# Patient Record
Sex: Male | Born: 1995 | Race: Black or African American | Hispanic: No | Marital: Single | Smoking: Current some day smoker
Health system: Southern US, Community
[De-identification: ages and names within clinical notes are randomized; demographics above are authoritative.]

---

## 2015-09-25 ENCOUNTER — Encounter (HOSPITAL_COMMUNITY): Payer: Self-pay | Admitting: Emergency Medicine

## 2015-09-25 ENCOUNTER — Ambulatory Visit (HOSPITAL_COMMUNITY)
Admission: EM | Admit: 2015-09-25 | Discharge: 2015-09-25 | Disposition: A | Discharge status: Home / Self Care | Payer: No Typology Code available for payment source | Attending: Family Medicine | Admitting: Family Medicine

## 2015-09-25 ENCOUNTER — Ambulatory Visit (INDEPENDENT_AMBULATORY_CARE_PROVIDER_SITE_OTHER): Payer: No Typology Code available for payment source

## 2015-09-25 DIAGNOSIS — M549 Dorsalgia, unspecified: Secondary | ICD-10-CM

## 2015-09-25 DIAGNOSIS — M542 Cervicalgia: Secondary | ICD-10-CM

## 2015-09-25 MED ORDER — DICLOFENAC POTASSIUM 50 MG PO TABS: 50.0000 mg | ORAL_TABLET | Freq: Three times a day (TID) | ORAL | Status: DC
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
## 2015-09-27 ENCOUNTER — Ambulatory Visit (HOSPITAL_COMMUNITY)
Admission: EM | Admit: 2015-09-27 | Discharge: 2015-09-27 | Disposition: A | Discharge status: Home / Self Care | Payer: No Typology Code available for payment source | Attending: Emergency Medicine | Admitting: Emergency Medicine

## 2015-09-27 ENCOUNTER — Encounter (HOSPITAL_COMMUNITY): Payer: Self-pay | Admitting: Emergency Medicine

## 2015-09-27 DIAGNOSIS — M62830 Muscle spasm of back: Secondary | ICD-10-CM

## 2015-09-27 MED ORDER — MELOXICAM 7.5 MG PO TABS: 7.5000 mg | ORAL_TABLET | Freq: Every day | ORAL | Status: DC

## 2015-09-27 MED ORDER — TRAMADOL HCL 50 MG PO TABS: 50.0000 mg | ORAL_TABLET | Freq: Four times a day (QID) | ORAL | Status: DC | PRN

## 2015-09-27 MED ORDER — CYCLOBENZAPRINE HCL 5 MG PO TABS: 5.0000 mg | ORAL_TABLET | Freq: Three times a day (TID) | ORAL | Status: DC | PRN
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
## 2015-09-27 NOTE — Discharge Instructions (Signed)
You have spasm in the muscles of your back. Stop the diclofenac as this is upsetting her stomach. Take meloxicam daily as needed for pain. Use the Flexeril every 8 hours as needed for pain and spasm. This medicine may make you drowsy. Use the tramadol every 6-8 hours as needed for severe pain. Do not drive while taking this medicine.  Moist heat to the back will also help relieve the spasm. Do gentle stretching. When you pick up your son, kneel down and lift with your legs rather than your back. This may take 2 weeks to get better. Follow-up as needed.

## 2015-10-16 ENCOUNTER — Encounter (HOSPITAL_COMMUNITY): Payer: Self-pay | Admitting: Emergency Medicine

## 2015-10-16 ENCOUNTER — Ambulatory Visit (HOSPITAL_COMMUNITY)
Admission: EM | Admit: 2015-10-16 | Discharge: 2015-10-16 | Disposition: A | Discharge status: Home / Self Care | Payer: No Typology Code available for payment source | Attending: Emergency Medicine | Admitting: Emergency Medicine

## 2015-10-16 DIAGNOSIS — M62838 Other muscle spasm: Secondary | ICD-10-CM

## 2015-10-16 DIAGNOSIS — M6249 Contracture of muscle, multiple sites: Secondary | ICD-10-CM

## 2015-10-16 MED ORDER — MELOXICAM 7.5 MG PO TABS: 7.5000 mg | ORAL_TABLET | Freq: Every day | ORAL | Status: DC

## 2015-10-16 MED ORDER — CYCLOBENZAPRINE HCL 5 MG PO TABS: 5.0000 mg | ORAL_TABLET | Freq: Three times a day (TID) | ORAL | Status: DC | PRN
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
## 2016-10-27 ENCOUNTER — Ambulatory Visit (HOSPITAL_COMMUNITY)
Admission: EM | Admit: 2016-10-27 | Discharge: 2016-10-27 | Disposition: A | Discharge status: Home / Self Care | Payer: BLUE CROSS/BLUE SHIELD | Attending: Family Medicine | Admitting: Family Medicine

## 2016-10-27 ENCOUNTER — Encounter (HOSPITAL_COMMUNITY): Payer: Self-pay | Admitting: Emergency Medicine

## 2016-10-27 ENCOUNTER — Ambulatory Visit (INDEPENDENT_AMBULATORY_CARE_PROVIDER_SITE_OTHER): Payer: BLUE CROSS/BLUE SHIELD

## 2016-10-27 DIAGNOSIS — S62632A Displaced fracture of distal phalanx of right middle finger, initial encounter for closed fracture: Secondary | ICD-10-CM

## 2016-10-27 DIAGNOSIS — S62630A Displaced fracture of distal phalanx of right index finger, initial encounter for closed fracture: Secondary | ICD-10-CM

## 2016-10-27 MED ORDER — ACETAMINOPHEN 325 MG PO TABS
975.0000 mg | ORAL_TABLET | Freq: Once | ORAL | Status: AC
  Administered 2016-10-27: 975 mg via ORAL

## 2016-10-27 MED ORDER — IBUPROFEN 800 MG PO TABS
800.0000 mg | ORAL_TABLET | Freq: Once | ORAL | Status: AC
  Administered 2016-10-27: 800 mg via ORAL

## 2016-10-27 MED ORDER — HYDROCODONE-ACETAMINOPHEN 5-325 MG PO TABS: 1.0000 | ORAL_TABLET | Freq: Four times a day (QID) | ORAL | 0 refills | Status: DC | PRN

## 2016-10-27 MED ORDER — ACETAMINOPHEN 325 MG PO TABS
ORAL_TABLET | ORAL | Status: AC
  Filled 2016-10-27: qty 3

## 2016-10-27 MED ORDER — IBUPROFEN 800 MG PO TABS
ORAL_TABLET | ORAL | Status: AC
  Filled 2016-10-27: qty 1
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
## 2016-10-30 ENCOUNTER — Ambulatory Visit (HOSPITAL_COMMUNITY)
Admission: EM | Admit: 2016-10-30 | Discharge: 2016-10-30 | Disposition: A | Discharge status: Home / Self Care | Payer: BLUE CROSS/BLUE SHIELD | Attending: Internal Medicine | Admitting: Internal Medicine

## 2016-10-30 ENCOUNTER — Encounter (HOSPITAL_COMMUNITY): Payer: Self-pay | Admitting: Emergency Medicine

## 2016-10-30 DIAGNOSIS — S62632D Displaced fracture of distal phalanx of right middle finger, subsequent encounter for fracture with routine healing: Secondary | ICD-10-CM

## 2016-10-30 MED ORDER — HYDROCODONE-ACETAMINOPHEN 5-325 MG PO TABS: 1.0000 | ORAL_TABLET | Freq: Four times a day (QID) | ORAL | 0 refills | Status: DC | PRN

## 2016-10-30 NOTE — ED Provider Notes (Signed)
CSN: [id number]     Arrival date & time 10/30/16  1038 History   None    Chief Complaint  Patient presents with  . Follow-up   (Consider location/radiation/quality/duration/timing/severity/associated sxs/prior Treatment) Patient c/o right hand fracture and is awaiting an appointment with hand surgery and is out of pain meds.   The history is provided by the patient.  Hand Pain  This is a new problem. The problem occurs constantly. The problem has not changed since onset.Nothing aggravates the symptoms. Nothing relieves the symptoms. He has tried nothing for the symptoms.    History reviewed. No pertinent past medical history. History reviewed. No pertinent surgical history. History reviewed. No pertinent family history. Social History  Substance Use Topics  . Smoking status: Current Some Day Smoker  . Smokeless tobacco: Never Used  . Alcohol use No    Review of Systems  Constitutional: Negative.   HENT: Negative.   Eyes: Negative.   Respiratory: Negative.   Cardiovascular: Negative.   Gastrointestinal: Negative.   Endocrine: Negative.   Genitourinary: Negative.   Musculoskeletal: Positive for arthralgias.  Allergic/Immunologic: Negative.   Neurological: Negative.   Hematological: Negative.   Psychiatric/Behavioral: Negative.     Allergies  Patient has no known allergies.  Home Medications   Prior to Admission medications   Medication Sig Start Date End Date Taking? Authorizing Provider  HYDROcodone-acetaminophen (NORCO/VICODIN) 5-325 MG tablet Take 1 tablet by mouth every 6 (six) hours as needed. 10/30/16   Deatra Canterxford, William J, FNP   Meds Ordered and Administered this Visit  Medications - No data to display  BP (!) 128/93 (BP Location: Left Arm)   Pulse 68   Temp 98.8 F (37.1 C) (Oral)   Resp 20   SpO2 100%  No data found.   Physical Exam  Constitutional: He is oriented to person, place, and time. He appears well-developed and well-nourished.  HENT:   Head: Normocephalic and atraumatic.  Eyes: Conjunctivae and EOM are normal. Pupils are equal, round, and reactive to light.  Neck: Normal range of motion. Neck supple.  Cardiovascular: Normal rate, regular rhythm and normal heart sounds.   Pulmonary/Chest: Effort normal and breath sounds normal.  Neurological: He is alert and oriented to person, place, and time.  Nursing note and vitals reviewed.   Urgent Care Course     Procedures (including critical care time)  Labs Review Labs Reviewed - No data to display  Imaging Review No results found.   Visual Acuity Review  Right Eye Distance:   Left Eye Distance:   Bilateral Distance:    Right Eye Near:   Left Eye Near:    Bilateral Near:         MDM   1. Displaced fracture of distal phalanx of right middle finger, subsequent encounter for fracture with routine healing     Hydrocodone 5/325 one po q 6 hours prn #10  Follow up with Dr. Glenna FellowsGramig [facility].  His office will call you with an appointment.       Deatra CanterOxford, William J, FNP 10/30/16 1236

## 2016-10-30 NOTE — Discharge Instructions (Signed)
Dr. Carlos LeveringGramig's office will call you today or Monday for an appointment ASAP.

## 2016-10-30 NOTE — ED Triage Notes (Signed)
Pt is here for a f/u from 6/28... Seen here for hand inj/fx  Also sts he ran out of pain meds.   Voices no new concerns... A&O x4... NAD... Ambulatory

## 2018-12-29 ENCOUNTER — Encounter (HOSPITAL_COMMUNITY): Payer: Self-pay | Admitting: Emergency Medicine

## 2018-12-29 ENCOUNTER — Ambulatory Visit (INDEPENDENT_AMBULATORY_CARE_PROVIDER_SITE_OTHER): Payer: BC Managed Care – PPO

## 2018-12-29 ENCOUNTER — Other Ambulatory Visit: Payer: Self-pay

## 2018-12-29 ENCOUNTER — Ambulatory Visit (HOSPITAL_COMMUNITY)
Admission: EM | Admit: 2018-12-29 | Discharge: 2018-12-29 | Disposition: A | Discharge status: Home / Self Care | Payer: BC Managed Care – PPO | Attending: Family Medicine | Admitting: Family Medicine

## 2018-12-29 DIAGNOSIS — L089 Local infection of the skin and subcutaneous tissue, unspecified: Secondary | ICD-10-CM

## 2018-12-29 DIAGNOSIS — T148XXA Other injury of unspecified body region, initial encounter: Secondary | ICD-10-CM

## 2018-12-29 DIAGNOSIS — S81812D Laceration without foreign body, left lower leg, subsequent encounter: Secondary | ICD-10-CM | POA: Diagnosis not present

## 2018-12-29 DIAGNOSIS — F17200 Nicotine dependence, unspecified, uncomplicated: Secondary | ICD-10-CM | POA: Insufficient documentation

## 2018-12-29 MED ORDER — SULFAMETHOXAZOLE-TRIMETHOPRIM 800-160 MG PO TABS: 1.0000 | ORAL_TABLET | Freq: Two times a day (BID) | ORAL | 0 refills | Status: AC

## 2018-12-29 MED ORDER — TRAMADOL HCL 50 MG PO TABS: 50.0000 mg | ORAL_TABLET | Freq: Four times a day (QID) | ORAL | 0 refills | Status: DC | PRN

## 2018-12-29 MED ORDER — IBUPROFEN 800 MG PO TABS: 800.0000 mg | ORAL_TABLET | Freq: Three times a day (TID) | ORAL | 0 refills | Status: DC

## 2018-12-29 NOTE — Discharge Instructions (Addendum)
Take the antibiotic 2 times a day.  This is for the wound infection in your leg. Wash your leg once a day with soap and water and apply a bandage.  You may use a small amount of antibiotic ointment like bacitracin Take ibuprofen 3 times a day with food.  This will help with moderate pain. If your pain is severe you may take tramadol.  Take this with food. Use crutches for walking.  Stay off your leg as much as you can.  Elevate your leg to reduce the swelling Return here in 2 days for a wound check.  Stitches will not come out until 10 days.  You are welcome to call for an appointment or check online
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
## 2019-01-01 LAB — AEROBIC CULTURE? (SUPERFICIAL SPECIMEN)

## 2019-01-01 LAB — AEROBIC CULTURE W GRAM STAIN (SUPERFICIAL SPECIMEN)

## 2019-01-02 ENCOUNTER — Telehealth (HOSPITAL_COMMUNITY): Payer: Self-pay | Admitting: Emergency Medicine

## 2019-01-02 NOTE — Telephone Encounter (Signed)
Treated with bactrim, contacted pt to see how he was feeling, pt states he is feeling better.

## 2019-07-16 ENCOUNTER — Ambulatory Visit (INDEPENDENT_AMBULATORY_CARE_PROVIDER_SITE_OTHER): Payer: BC Managed Care – PPO

## 2019-07-16 ENCOUNTER — Ambulatory Visit (HOSPITAL_COMMUNITY)
Admission: EM | Admit: 2019-07-16 | Discharge: 2019-07-16 | Disposition: A | Discharge status: Home / Self Care | Payer: BC Managed Care – PPO | Attending: Family Medicine | Admitting: Family Medicine

## 2019-07-16 ENCOUNTER — Other Ambulatory Visit: Payer: Self-pay

## 2019-07-16 ENCOUNTER — Ambulatory Visit (HOSPITAL_COMMUNITY): Payer: BC Managed Care – PPO

## 2019-07-16 ENCOUNTER — Encounter (HOSPITAL_COMMUNITY): Payer: Self-pay

## 2019-07-16 DIAGNOSIS — S60519A Abrasion of unspecified hand, initial encounter: Secondary | ICD-10-CM | POA: Diagnosis not present

## 2019-07-16 DIAGNOSIS — M79641 Pain in right hand: Secondary | ICD-10-CM

## 2019-07-16 DIAGNOSIS — S60221A Contusion of right hand, initial encounter: Secondary | ICD-10-CM | POA: Diagnosis not present

## 2019-07-16 DIAGNOSIS — M79642 Pain in left hand: Secondary | ICD-10-CM

## 2019-07-16 MED ORDER — BACITRACIN ZINC 500 UNIT/GM EX OINT
TOPICAL_OINTMENT | CUTANEOUS | Status: AC
  Filled 2019-07-16: qty 28.35

## 2019-07-16 MED ORDER — MUPIROCIN 2 % EX OINT: 1.0000 "application " | TOPICAL_OINTMENT | Freq: Three times a day (TID) | CUTANEOUS | 1 refills | Status: AC

## 2019-07-16 MED ORDER — TRAMADOL HCL 50 MG PO TABS: 50.0000 mg | ORAL_TABLET | Freq: Four times a day (QID) | ORAL | 0 refills | Status: AC | PRN

## 2019-07-16 NOTE — ED Triage Notes (Signed)
Pt is here with hand injurys to both hands, states he fell & caught his self on his hands yesterday morning. States he thinks his thumbs are messed up, they hurt the worst.
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
## 2019-07-16 NOTE — Discharge Instructions (Addendum)
Wash hands at least twice a day and then put Mupiricin dressing with Telfa, gauze and coban.  Recheck the hand on Wednesday next.

## 2019-07-19 ENCOUNTER — Other Ambulatory Visit: Payer: Self-pay

## 2019-07-19 ENCOUNTER — Encounter (HOSPITAL_COMMUNITY): Payer: Self-pay | Admitting: Emergency Medicine

## 2019-07-19 ENCOUNTER — Ambulatory Visit (HOSPITAL_COMMUNITY)
Admission: EM | Admit: 2019-07-19 | Discharge: 2019-07-19 | Disposition: A | Discharge status: Home / Self Care | Payer: BC Managed Care – PPO | Attending: Urgent Care | Admitting: Urgent Care

## 2019-07-19 DIAGNOSIS — S60511D Abrasion of right hand, subsequent encounter: Secondary | ICD-10-CM

## 2019-07-19 DIAGNOSIS — S60419D Abrasion of unspecified finger, subsequent encounter: Secondary | ICD-10-CM

## 2019-07-19 DIAGNOSIS — S60221D Contusion of right hand, subsequent encounter: Secondary | ICD-10-CM

## 2019-07-19 DIAGNOSIS — M79641 Pain in right hand: Secondary | ICD-10-CM

## 2019-07-19 MED ORDER — NAPROXEN 500 MG PO TABS: 500.0000 mg | ORAL_TABLET | Freq: Two times a day (BID) | ORAL | 0 refills | Status: AC

## 2019-07-19 NOTE — ED Provider Notes (Signed)
[facility]   MRN: [id number] DOB: 16-Oct-1995  Subjective:   Vincent Ball is a 24 y.o. male presenting for recheck on his bilateral hand wounds.  Patient is requesting a note for work as he still had to miss this weeks work days.  He still has persistent right hand pain and some swelling.  Has only been using tramadol.  He is nursing his wounds as instructed.  Denies fever, redness, drainage of pus or bleeding.  No current facility-administered medications for this encounter.  Current Outpatient Medications:  .  mupirocin ointment (BACTROBAN) 2 %, Apply 1 application topically 3 (three) times daily., Disp: 30 g, Rfl: 1 .  traMADol (ULTRAM) 50 MG tablet, Take 1-2 tablets (50-100 mg total) by mouth every 6 (six) hours as needed., Disp: 20 tablet, Rfl: 0 .  ibuprofen (ADVIL) 800 MG tablet, Take 1 tablet (800 mg total) by mouth 3 (three) times daily., Disp: 21 tablet, Rfl: 0   No Known Allergies  History reviewed. No pertinent past medical history.   History reviewed. No pertinent surgical history.  Family History  Problem Relation Age of Onset  . Healthy Mother   . Healthy Father     Social History   Tobacco Use  . Smoking status: Current Some Day Smoker  . Smokeless tobacco: Never Used  Substance Use Topics  . Alcohol use: No  . Drug use: No    ROS   Objective:   Vitals: BP (!) 169/95   Pulse 86   Temp 98.6 F (37 C) (Oral)   Resp 16   SpO2 97%   Physical Exam Constitutional:      General: He is not in acute distress.    Appearance: Normal appearance. He is well-developed and normal weight. He is not ill-appearing, toxic-appearing or diaphoretic.  HENT:     Head: Normocephalic and atraumatic.     Right Ear: External ear normal.     Left Ear: External ear normal.     Nose: Nose normal.     Mouth/Throat:     Pharynx: Oropharynx is clear.  Eyes:     General: No scleral icterus.       Right eye: No discharge.        Left eye: No discharge.   Extraocular Movements: Extraocular movements intact.     Pupils: Pupils are equal, round, and reactive to light.  Cardiovascular:     Rate and Rhythm: Normal rate.  Pulmonary:     Effort: Pulmonary effort is normal.  Musculoskeletal:     Right hand: Tenderness (over thenar eminence of right thumb) present. No swelling, deformity, lacerations or bony tenderness. Normal range of motion. Normal strength. Normal sensation. Normal capillary refill.     Left hand: No swelling, deformity, lacerations, tenderness or bony tenderness. Normal range of motion. Normal strength. Normal sensation. Normal capillary refill.       Arms:     Cervical back: Normal range of motion.  Neurological:     Mental Status: He is alert and oriented to person, place, and time.  Psychiatric:        Mood and Affect: Mood normal.        Behavior: Behavior normal.        Thought Content: Thought content normal.        Judgment: Judgment normal.      Assessment and Plan :   1. Contusion of right hand, subsequent encounter   2. Right hand pain   3. Abrasion of  right hand, subsequent encounter   4. Abrasion of finger of left hand, subsequent encounter     Resolving abrasions, wound care reviewed.  Start naproxen for pain and inflammation.  Use tramadol as needed for severe pain. Counseled patient on potential for adverse effects with medications prescribed/recommended today, ER and return-to-clinic precautions discussed, patient verbalized understanding.    Jaynee Eagles, [place] 07/19/19 [phone]

## 2019-07-19 NOTE — Discharge Instructions (Signed)
Please change your dressing 2-3 times daily. Do not apply any ointments or creams. Each time you change your dressing, make sure you clean gently around the perimeter of the wound with gentle soap and warm water. Pat your wound dry and let it air out if possible for 1-2 hours before reapplying another dressing.  °
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
## 2021-06-13 ENCOUNTER — Emergency Department (HOSPITAL_COMMUNITY): Payer: BC Managed Care – PPO

## 2021-06-13 ENCOUNTER — Other Ambulatory Visit: Payer: Self-pay

## 2021-06-13 ENCOUNTER — Encounter (HOSPITAL_COMMUNITY): Payer: Self-pay

## 2021-06-13 ENCOUNTER — Emergency Department (HOSPITAL_COMMUNITY)
Admission: EM | Admit: 2021-06-13 | Discharge: 2021-06-13 | Disposition: A | Discharge status: Home / Self Care | Payer: BC Managed Care – PPO | Attending: Emergency Medicine | Admitting: Emergency Medicine

## 2021-06-13 ENCOUNTER — Ambulatory Visit (HOSPITAL_COMMUNITY)
Admission: EM | Admit: 2021-06-13 | Discharge: 2021-06-13 | Disposition: A | Discharge status: Other Acute Inpatient Hospital | Payer: Self-pay | Attending: Internal Medicine | Admitting: Internal Medicine

## 2021-06-13 DIAGNOSIS — N50811 Right testicular pain: Secondary | ICD-10-CM

## 2021-06-13 DIAGNOSIS — N451 Epididymitis: Secondary | ICD-10-CM | POA: Insufficient documentation

## 2021-06-13 DIAGNOSIS — Z20822 Contact with and (suspected) exposure to covid-19: Secondary | ICD-10-CM | POA: Insufficient documentation

## 2021-06-13 LAB — COMPREHENSIVE METABOLIC PANEL
ALT: 22 U/L (ref 0–44)
AST: 26 U/L (ref 15–41)
Albumin: 4.3 g/dL (ref 3.5–5.0)
Alkaline Phosphatase: 50 U/L (ref 38–126)
Anion gap: 9 (ref 5–15)
BUN: 9 mg/dL (ref 6–20)
CO2: 26 mmol/L (ref 22–32)
Calcium: 9.2 mg/dL (ref 8.9–10.3)
Chloride: 103 mmol/L (ref 98–111)
Creatinine, Ser: 1.14 mg/dL (ref 0.61–1.24)
GFR, Estimated: >60 mL/min (ref >60)
Glucose, Bld: 101 mg/dL — ABNORMAL HIGH (ref 70–99)
Potassium: 3.5 mmol/L (ref 3.5–5.1)
Sodium: 138 mmol/L (ref 135–145)
Total Bilirubin: 0.7 mg/dL (ref 0.3–1.2)
Total Protein: 7.1 g/dL (ref 6.5–8.1)

## 2021-06-13 LAB — RESP PANEL BY RT-PCR (FLU A&B, COVID) ARPGX2
Influenza A by PCR: NEGATIVE
Influenza B by PCR: NEGATIVE
SARS Coronavirus 2 by RT PCR: NEGATIVE

## 2021-06-13 LAB — URINALYSIS, ROUTINE W REFLEX MICROSCOPIC
Bilirubin Urine: NEGATIVE
Glucose, UA: NEGATIVE mg/dL
Hgb urine dipstick: NEGATIVE
Ketones, ur: NEGATIVE mg/dL
Leukocytes,Ua: NEGATIVE
Nitrite: NEGATIVE
Protein, ur: NEGATIVE mg/dL
Specific Gravity, Urine: 1.015 (ref 1.005–1.030)
pH: 6 (ref 5.0–8.0)

## 2021-06-13 LAB — CBC WITH DIFFERENTIAL/PLATELET
Abs Immature Granulocytes: 0.04 10*3/uL (ref 0.00–0.07)
Basophils Absolute: 0 10*3/uL (ref 0.0–0.1)
Basophils Relative: 0 %
Eosinophils Absolute: 0 10*3/uL (ref 0.0–0.5)
Eosinophils Relative: 0 %
HCT: 36.8 % — ABNORMAL LOW (ref 39.0–52.0)
Hemoglobin: 11.7 g/dL — ABNORMAL LOW (ref 13.0–17.0)
Immature Granulocytes: 0 %
Lymphocytes Relative: 5 %
Lymphs Abs: 0.6 10*3/uL — ABNORMAL LOW (ref 0.7–4.0)
MCH: 28.4 pg (ref 26.0–34.0)
MCHC: 31.8 g/dL (ref 30.0–36.0)
MCV: 89.3 fL (ref 80.0–100.0)
Monocytes Absolute: 0.4 10*3/uL (ref 0.1–1.0)
Monocytes Relative: 3 %
Neutro Abs: 12.4 10*3/uL — ABNORMAL HIGH (ref 1.7–7.7)
Neutrophils Relative %: 92 %
Platelets: 153 10*3/uL (ref 150–400)
RBC: 4.12 MIL/uL — ABNORMAL LOW (ref 4.22–5.81)
RDW: 11.5 % (ref 11.5–15.5)
WBC: 13.5 10*3/uL — ABNORMAL HIGH (ref 4.0–10.5)
nRBC: 0 % (ref 0.0–0.2)

## 2021-06-13 LAB — LACTIC ACID, PLASMA: Lactic Acid, Venous: 1 mmol/L (ref 0.5–1.9)

## 2021-06-13 MED ORDER — CEFTRIAXONE SODIUM 500 MG IJ SOLR
500.0000 mg | Freq: Once | INTRAMUSCULAR | Status: AC
  Administered 2021-06-13: 500 mg via INTRAMUSCULAR
  Filled 2021-06-13: qty 500

## 2021-06-13 MED ORDER — CIPROFLOXACIN HCL 500 MG PO TABS: 500.0000 mg | ORAL_TABLET | Freq: Two times a day (BID) | ORAL | 0 refills | Status: AC

## 2021-06-13 MED ORDER — LIDOCAINE HCL (PF) 1 % IJ SOLN
INTRAMUSCULAR | Status: AC
  Filled 2021-06-13: qty 5

## 2021-06-13 MED ORDER — ACETAMINOPHEN 325 MG PO TABS
650.0000 mg | ORAL_TABLET | Freq: Once | ORAL | Status: AC
  Administered 2021-06-13: 650 mg via ORAL

## 2021-06-13 MED ORDER — AZITHROMYCIN 250 MG PO TABS
1000.0000 mg | ORAL_TABLET | Freq: Once | ORAL | Status: AC
  Administered 2021-06-13: 1000 mg via ORAL
  Filled 2021-06-13: qty 4

## 2021-06-13 MED ORDER — ACETAMINOPHEN 325 MG PO TABS
ORAL_TABLET | ORAL | Status: AC
  Filled 2021-06-13: qty 2

## 2021-06-13 NOTE — ED Provider Triage Note (Addendum)
Emergency Medicine Provider Triage Evaluation Note  Vincent Ball , a 26 y.o. male  was evaluated in triage.  Pt complains of right testicle pain and swelling.  Reports similar episode 1 month ago that resolved, thought to be related to overuse, came back today while he was at work.  Also reports urinary frequency, denies urethral discharge.  Does report lesions to his penis. Febrile, given APAP at 5 by UC and sent to ER for eval Review of Systems  Positive: Lesions, testicle pain, swelling, fever Negative: Dysuria, discharge  Physical Exam  There were no vitals taken for this visit. Gen:   Awake, no distress   Resp:  Normal effort  MSK:   Moves extremities without difficulty  Other:  Swelling with tenderness of the right testicle  Medical Decision Making  Medically screening exam initiated at 6:15 PM.  Appropriate orders placed.  Vincent Ball was informed that the remainder of the evaluation will be completed by another provider, this initial triage assessment does not replace that evaluation, and the importance of remaining in the ED until their evaluation is complete.     Tacy Learn, PA-C 06/13/21 1819    Tacy Learn, PA-C 06/13/21 Vincent Ball

## 2021-06-13 NOTE — Discharge Instructions (Signed)
Begin taking Cipro as prescribed.  Take ibuprofen 600 mg every 6 hours as needed for pain.  We will call you if your culture requires further treatment or the need for additional action.  Return to the emergency department if symptoms significantly worsen or change.

## 2021-06-13 NOTE — Discharge Instructions (Addendum)
-  Head straight to the ED for further management. I'm concerned you have a bad infection in the testicle. This will likely require an ultrasound imaging and antibiotics, as well as rapid labwork I can't perform here.

## 2021-06-13 NOTE — ED Provider Notes (Signed)
[facility]    CSN: [id number] Arrival date & time: 06/13/21  1716      History   Chief Complaint Chief Complaint  Patient presents with   Abdominal Pain   Testicle Pain    HPI JESHURUN OAXACA is a 26 y.o. male presenting with R testicle pain x1 month. Medical history noncontributory. States symptoms improved but then got a lot worse few days ago. R testicle is large and swollen. There is now abdominal pain as well. Denies other symptoms like penile discharge. Hasn't attempted interventions at home.  HPI  History reviewed. No pertinent past medical history.  There are no problems to display for this patient.   History reviewed. No pertinent surgical history.     Home Medications    Prior to Admission medications   Medication Sig Start Date End Date Taking? Authorizing Provider  mupirocin ointment (BACTROBAN) 2 % Apply 1 application topically 3 (three) times daily. Patient not taking: Reported on 06/13/2021 07/16/19   Elvina Sidle, MD  naproxen (NAPROSYN) 500 MG tablet Take 1 tablet (500 mg total) by mouth 2 (two) times daily. Patient not taking: Reported on 06/13/2021 07/19/19   Wallis Bamberg, PA-C  traMADol (ULTRAM) 50 MG tablet Take 1-2 tablets (50-100 mg total) by mouth every 6 (six) hours as needed. Patient not taking: Reported on 06/13/2021 07/16/19   Elvina Sidle, MD    Family History Family History  Problem Relation Age of Onset   Healthy Mother    Healthy Father     Social History Social History   Tobacco Use   Smoking status: Some Days   Smokeless tobacco: Never  Substance Use Topics   Alcohol use: Yes   Drug use: Yes    Types: Marijuana     Allergies   Patient has no known allergies.   Review of Systems Review of Systems  Genitourinary:  Positive for testicular pain.  All other systems reviewed and are negative.   Physical Exam Triage Vital Signs ED Triage Vitals  Enc Vitals Group     BP 06/13/21 1746 118/68     Pulse  Rate 06/13/21 1746 (!) 112     Resp 06/13/21 1746 14     Temp 06/13/21 1746 (!) 102.2 F (39 C)     Temp Source 06/13/21 1746 Oral     SpO2 06/13/21 1746 98 %     Weight --      Height --      Head Circumference --      Peak Flow --      Pain Score 06/13/21 1747 5     Pain Loc --      Pain Edu? --      Excl. in GC? --    No data found.  Updated Vital Signs BP 118/68 (BP Location: Left Arm)    Pulse (!) 112    Temp (!) 102.2 F (39 C) (Oral)    Resp 14    SpO2 98%   Visual Acuity Right Eye Distance:   Left Eye Distance:   Bilateral Distance:    Right Eye Near:   Left Eye Near:    Bilateral Near:     Physical Exam Vitals reviewed.  Constitutional:      General: He is not in acute distress.    Appearance: Normal appearance. He is not ill-appearing.  HENT:     Head: Normocephalic and atraumatic.  Cardiovascular:     Rate and Rhythm: Tachycardia present.  Pulmonary:  Effort: Pulmonary effort is normal.  Genitourinary:    Comments: declined Neurological:     General: No focal deficit present.     Mental Status: He is alert and oriented to person, place, and time.  Psychiatric:        Mood and Affect: Mood normal.        Behavior: Behavior normal.        Thought Content: Thought content normal.        Judgment: Judgment normal.     UC Treatments / Results  Labs (all labs ordered are listed, but only abnormal results are displayed) Labs Reviewed - No data to display  EKG   Radiology No results found.  Procedures Procedures (including critical care time)  Medications Ordered in UC Medications  acetaminophen (TYLENOL) tablet 650 mg (650 mg Oral Given 06/13/21 1752)    Initial Impression / Assessment and Plan / UC Course  I have reviewed the triage vital signs and the nursing notes.  Pertinent labs & imaging results that were available during my care of the patient were reviewed by me and considered in my medical decision making (see chart for  details).     This patient is a very pleasant 26 y.o. year old male presenting with R testicular pain and swelling, as well as fevers of 102.2 and tachycardia. Acetaminophen administered during visit. Concern for epididymitis/ sepsis. Sent to ED via personal vehicle, he is in agreement.   Final Clinical Impressions(s) / UC Diagnoses   Final diagnoses:  Pain in right testicle     Discharge Instructions      -Head straight to the ED for further management. I'm concerned you have a bad infection in the testicle. This will likely require an ultrasound imaging and antibiotics, as well as rapid labwork I can't perform here.   ED Prescriptions   None    PDMP not reviewed this encounter.   Rhys Martini, PA-C 06/13/21 1819

## 2021-06-13 NOTE — ED Provider Notes (Signed)
[facility] EMERGENCY DEPARTMENT Provider Note   CSN: [id number] Arrival date & time: 06/13/21  1810     History  Chief Complaint  Patient presents with   Groin Swelling    JEFF FRIEDEN is a 26 y.o. male.  Patient is a 26 year old male with no significant past medical history.  Patient presenting today for evaluation of right testicle pain and swelling.  This started several weeks ago, but worsened significantly over the past few days.  Patient arrives here febrile with temp of 102, but denies fevers at home.  He denies any difficulty urinating.  He denies any urethral discharge, rash, or bowel complaints.  Pain is worse with sitting and movement.  There are no alleviating factors.  The history is provided by the patient.      Home Medications Prior to Admission medications   Medication Sig Start Date End Date Taking? Authorizing Provider  mupirocin ointment (BACTROBAN) 2 % Apply 1 application topically 3 (three) times daily. Patient not taking: Reported on 06/13/2021 07/16/19   Elvina Sidle, MD  naproxen (NAPROSYN) 500 MG tablet Take 1 tablet (500 mg total) by mouth 2 (two) times daily. Patient not taking: Reported on 06/13/2021 07/19/19   Wallis Bamberg, PA-C  traMADol (ULTRAM) 50 MG tablet Take 1-2 tablets (50-100 mg total) by mouth every 6 (six) hours as needed. Patient not taking: Reported on 06/13/2021 07/16/19   Elvina Sidle, MD      Allergies    Patient has no known allergies.    Review of Systems   Review of Systems  All other systems reviewed and are negative.  Physical Exam Updated Vital Signs BP 101/76 (BP Location: Right Arm)    Pulse 96    Temp 99.9 F (37.7 C) (Oral)    Resp 20    Ht 5\' 6"  (1.676 m)    Wt 63.5 kg    SpO2 99%    BMI 22.60 kg/m  Physical Exam Vitals and nursing note reviewed.  Constitutional:      General: He is not in acute distress.    Appearance: Normal appearance. He is not ill-appearing.  HENT:     Head:  Normocephalic and atraumatic.  Pulmonary:     Effort: Pulmonary effort is normal.  Genitourinary:    Comments: The right testicle is swollen and tender.  Testicle is freely mobile within the scrotum.  There is no rash, or other abnormality. Skin:    General: Skin is warm and dry.  Neurological:     Mental Status: He is alert and oriented to person, place, and time.    ED Results / Procedures / Treatments   Labs (all labs ordered are listed, but only abnormal results are displayed) Labs Reviewed  CBC WITH DIFFERENTIAL/PLATELET - Abnormal; Notable for the following components:      Result Value   WBC 13.5 (*)    RBC 4.12 (*)    Hemoglobin 11.7 (*)    HCT 36.8 (*)    Neutro Abs 12.4 (*)    Lymphs Abs 0.6 (*)    All other components within normal limits  COMPREHENSIVE METABOLIC PANEL - Abnormal; Notable for the following components:   Glucose, Bld 101 (*)    All other components within normal limits  RESP PANEL BY RT-PCR (FLU A&B, COVID) ARPGX2  URINALYSIS, ROUTINE W REFLEX MICROSCOPIC  LACTIC ACID, PLASMA  GC/CHLAMYDIA PROBE AMP ([place]) NOT AT [facility]    EKG None  Radiology [facility] SCROTUM W/DOPPLER  Result Date: 06/13/2021 CLINICAL DATA:  Right testicular pain over the last month. EXAM: SCROTAL ULTRASOUND DOPPLER ULTRASOUND OF THE TESTICLES TECHNIQUE: Complete ultrasound examination of the testicles, epididymis, and other scrotal structures was performed. Color and spectral Doppler ultrasound were also utilized to evaluate blood flow to the testicles. COMPARISON:  None. FINDINGS: Right testicle Measurements: 4.2 x 2.6 x 3.2 cm, slightly enlarged. Mildly heterogeneous nature with increased vascularity, consistent with epididymo-orchitis. No evidence of a focal mass lesion or abscess. Left testicle Measurements: 4.1 x 1.7 x 2.8 cm. No focal lesion. Normal color flow and Doppler evaluation. Right epididymis: Enlarged and hypervascular consistent with epididymo-orchitis. Left  epididymis:  Normal in size and appearance. Hydrocele:  Positive right hydrocele without complicating feature. Varicocele:  Bilateral varicoceles. Pulsed Doppler interrogation of both testes demonstrates hyperemia on the right. No evidence of torsion. IMPRESSION: Right epididymo-orchitis with mild reactive uncomplicated hydrocele. No evidence of abscess, mass or torsion. Bilateral varicoceles. Electronically Signed   By: Paulina Fusi M.D.   On: 06/13/2021 19:11    Procedures Procedures    Medications Ordered in ED Medications  cefTRIAXone (ROCEPHIN) injection 500 mg (has no administration in time range)  azithromycin (ZITHROMAX) tablet 1,000 mg (has no administration in time range)    ED Course/ Medical Decision Making/ A&P  Patient presenting here with right testicle pain and swelling.  He is also febrile with temp of 102.  Patient's urinalysis is clear and laboratory studies show a white count of 13.5, but are otherwise unremarkable.  COVID is negative.  Ultrasound performed shows right epididymal orchitis with mild reactive uncomplicated hydrocele, but no evidence of torsion.  Patient to be treated with Rocephin and Zithromax as he admits to unprotected sex with several partners.  GC and Chlamydia test added onto the urine.  He will be discharged with Cipro for 2 weeks.  He is to follow-up as needed if not improving.  Final Clinical Impression(s) / ED Diagnoses Final diagnoses:  None    Rx / DC Orders ED Discharge Orders     None         Geoffery Lyons, MD 06/13/21 2315

## 2021-06-13 NOTE — ED Triage Notes (Signed)
Patient c/o right testicle swelling x 1 month. States pain has been intermittent. Reports after laying down today pain has radiated into abdomen. Patient sweaty in triage. States he was given tylenol at [facility] before coming.

## 2021-06-13 NOTE — ED Triage Notes (Signed)
Pt presents with rt testicle pain that began 1 month ago but then subsided and reoccurred today. Pt states rt testicle is larger than the left and pain has migrated to his abdomen.

## 2022-02-07 IMAGING — DX DG HAND COMPLETE 3+V*L*
3 series · 3 of 3 positions shown · non-contrast
Comparison: None.

CLINICAL DATA: Fall, injury, pain

EXAM:
LEFT HAND - COMPLETE 3+ VIEW

[hand pa]
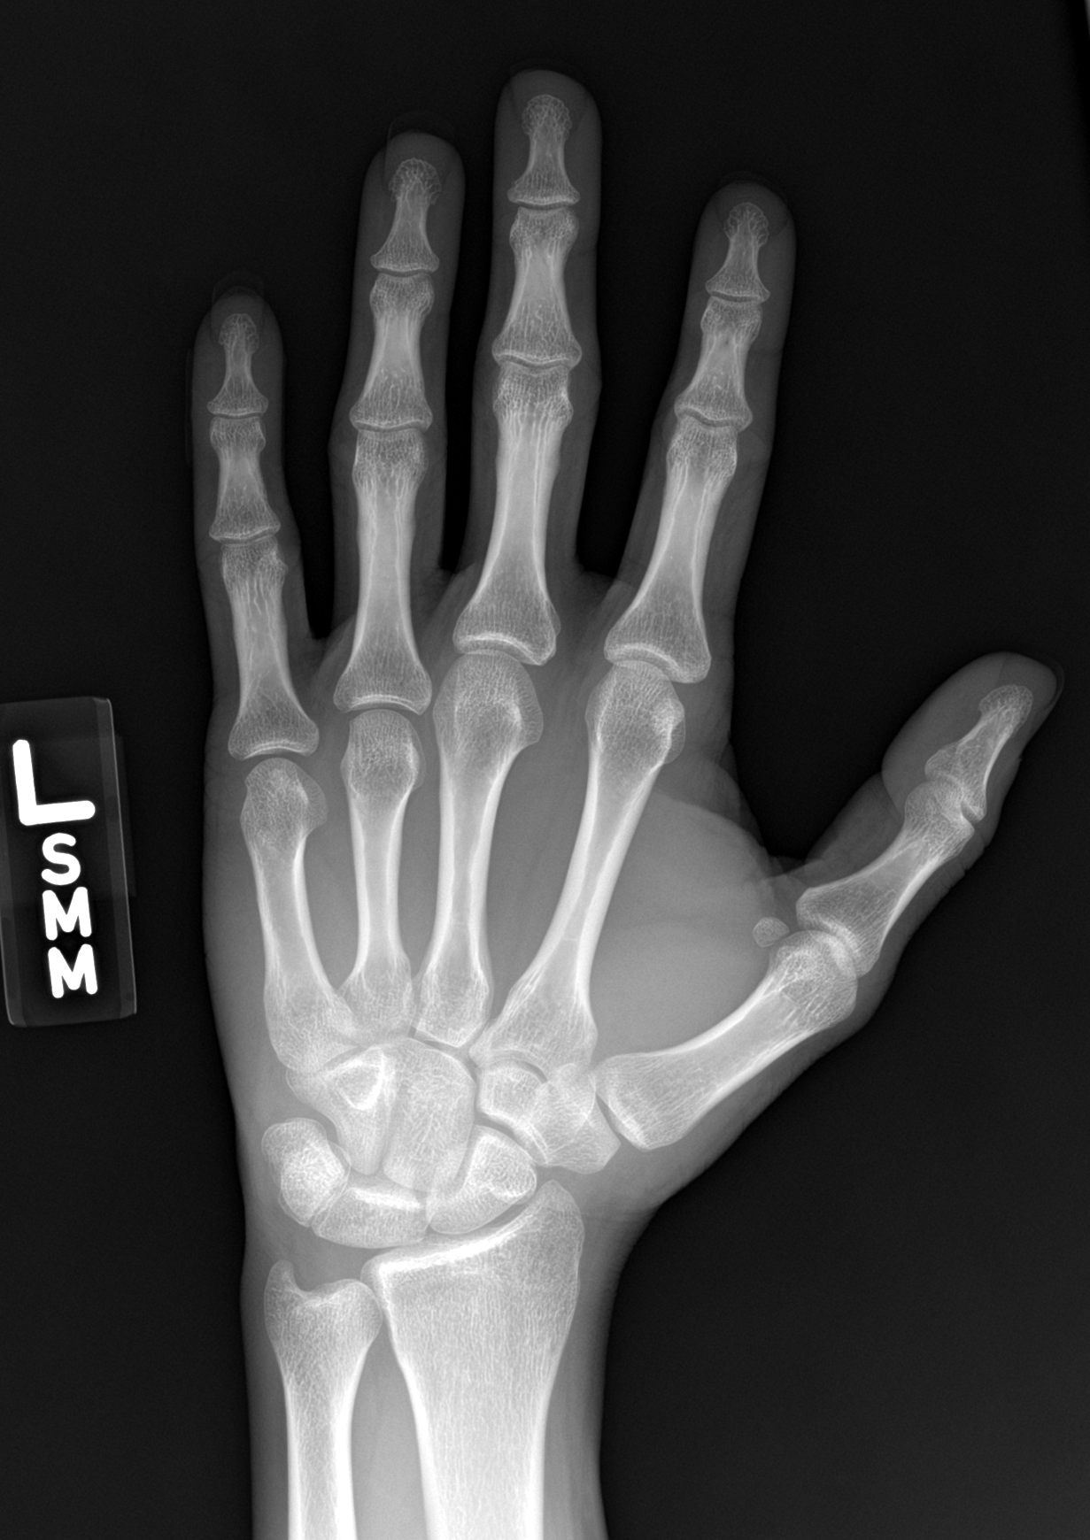

[hand obl]
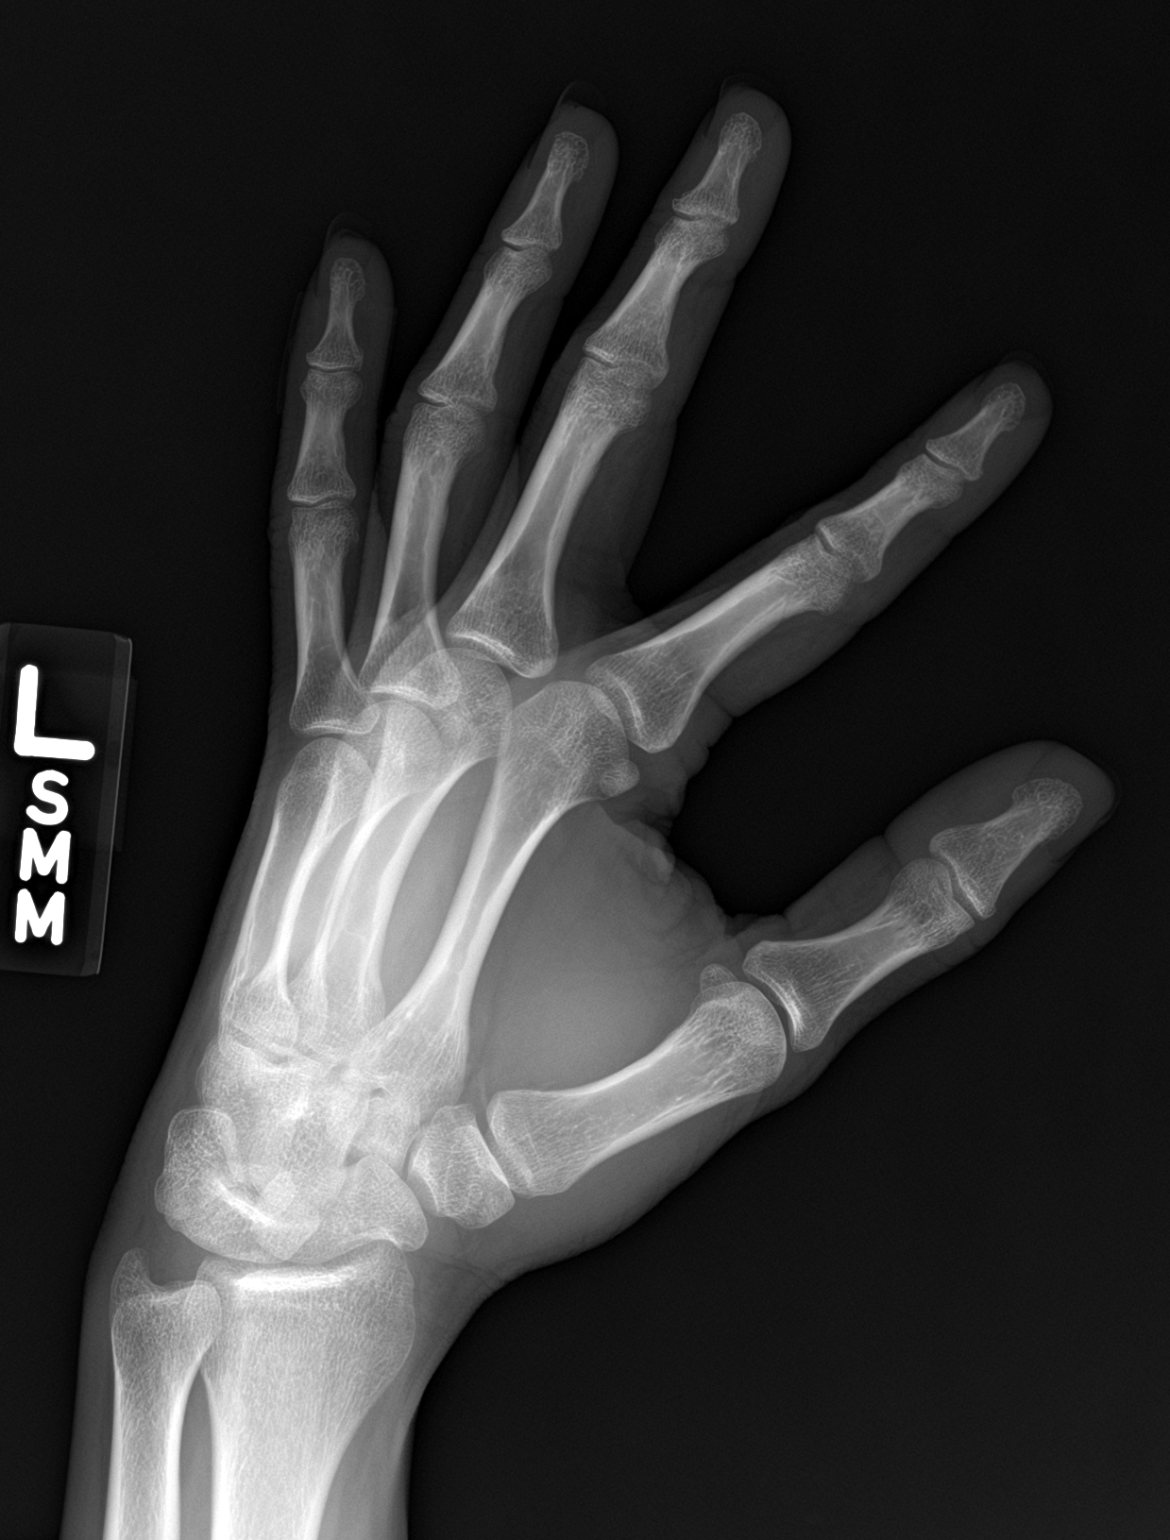

[hand lat]
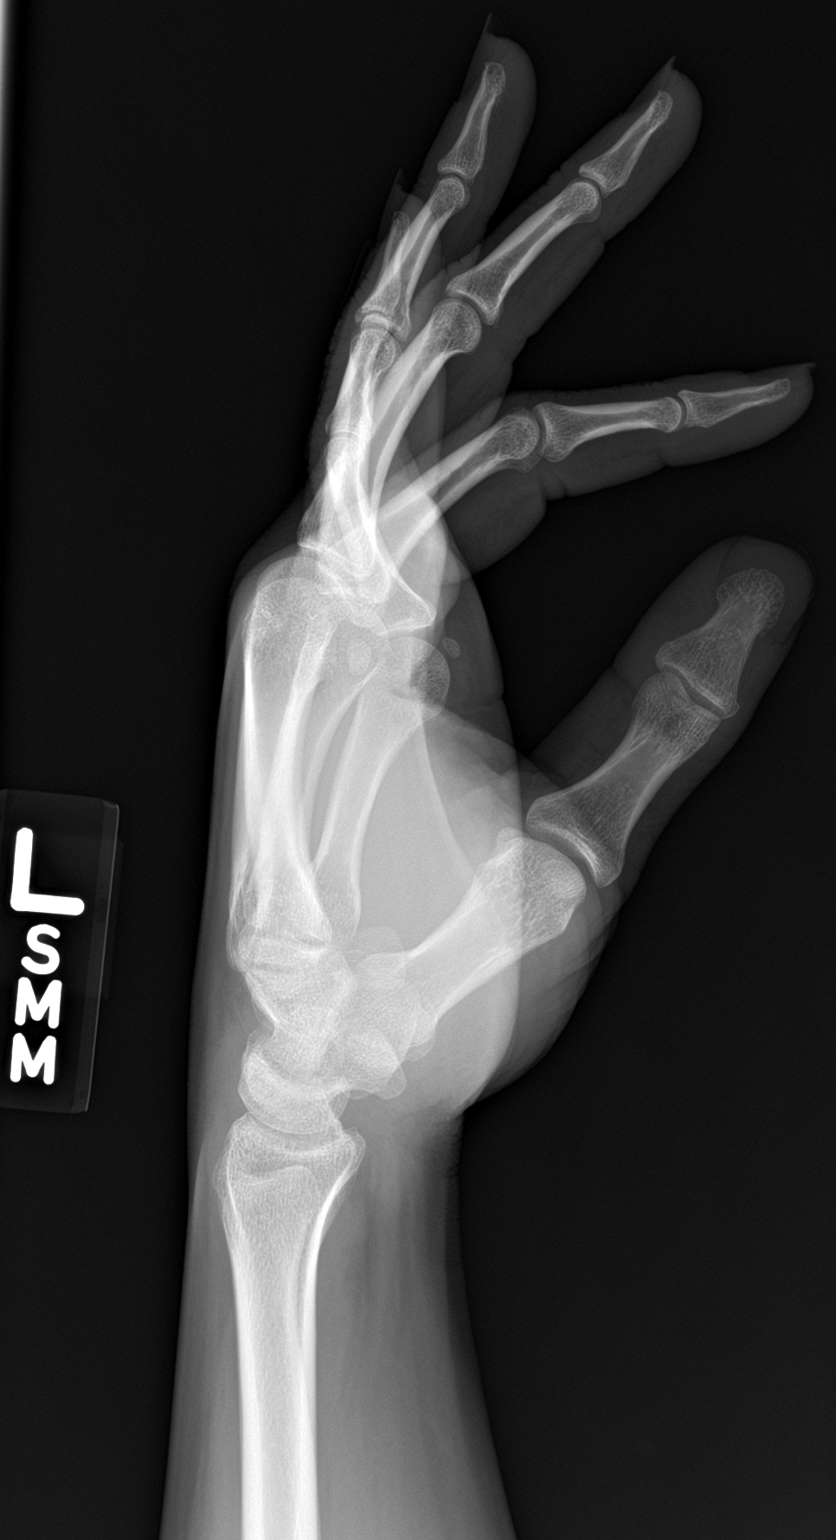

[3 of 3 positions shown; findings below may reference images not displayed]

FINDINGS: There is no evidence of fracture or dislocation. There is no
evidence of arthropathy or other focal bone abnormality. Soft
tissues are unremarkable.
IMPRESSION: Negative.

## 2024-01-06 IMAGING — US US SCROTUM W/ DOPPLER COMPLETE
1 series · 13 of 25 positions shown · non-contrast
Comparison: None.

CLINICAL DATA: Right testicular pain over the last month.

EXAM:
SCROTAL ULTRASOUND
DOPPLER ULTRASOUND OF THE TESTICLES
TECHNIQUE: Complete ultrasound examination of the testicles, epididymis, and
other scrotal structures was performed. Color and spectral Doppler
ultrasound were also utilized to evaluate blood flow to the
testicles.

[Series 1: us scrotum w/doppler · 13 of 83 slices shown]
[im 1/83]
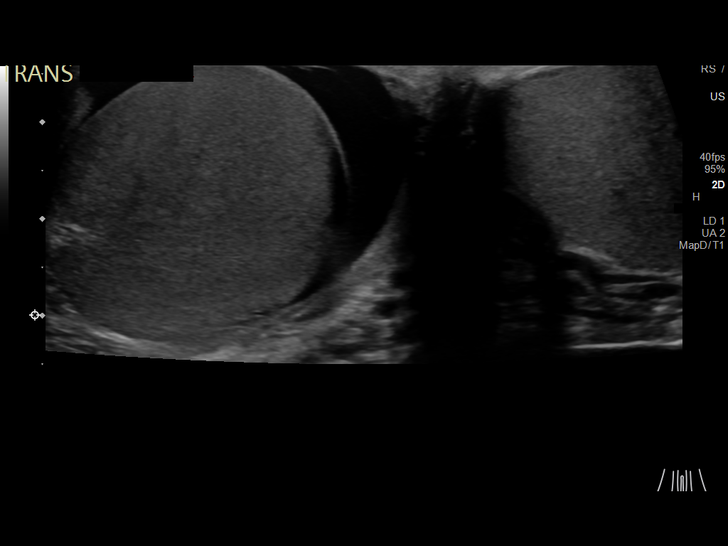
[im 7/83]
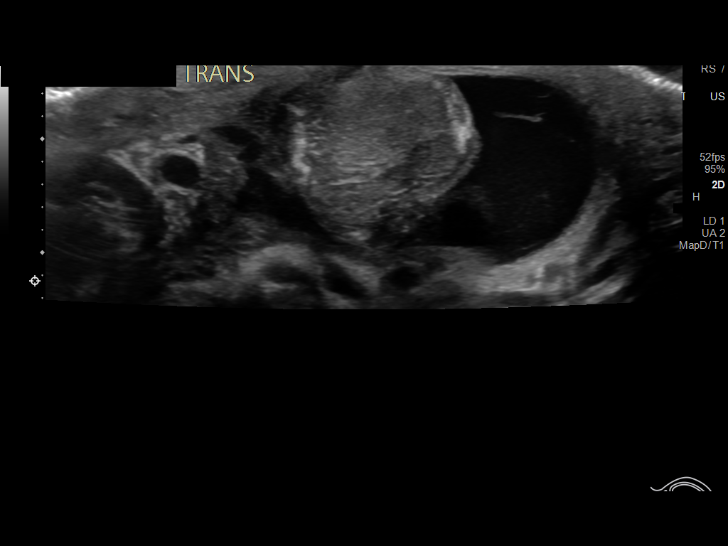
[im 14/83]
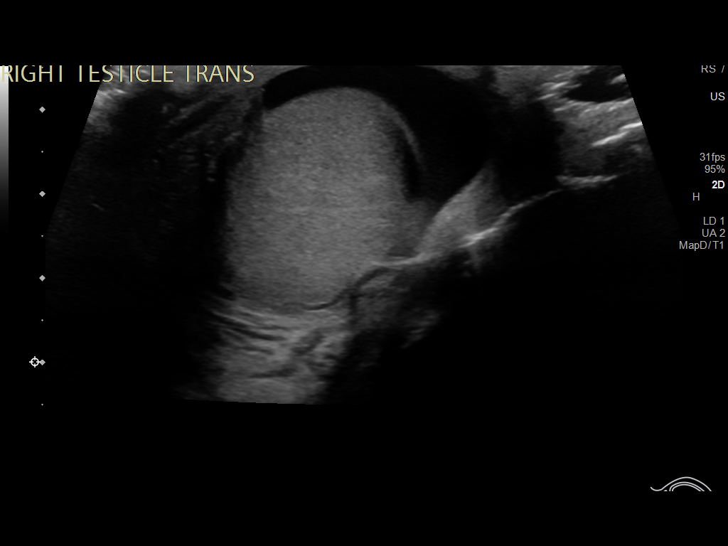
[im 21/83]
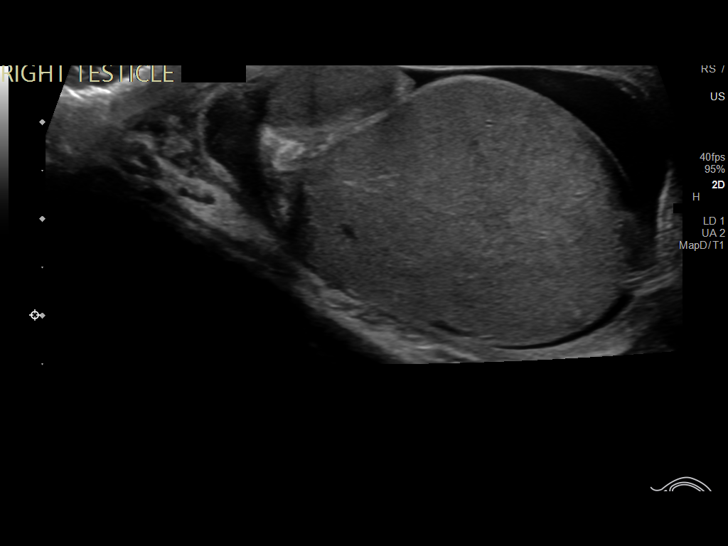
[im 28/83]
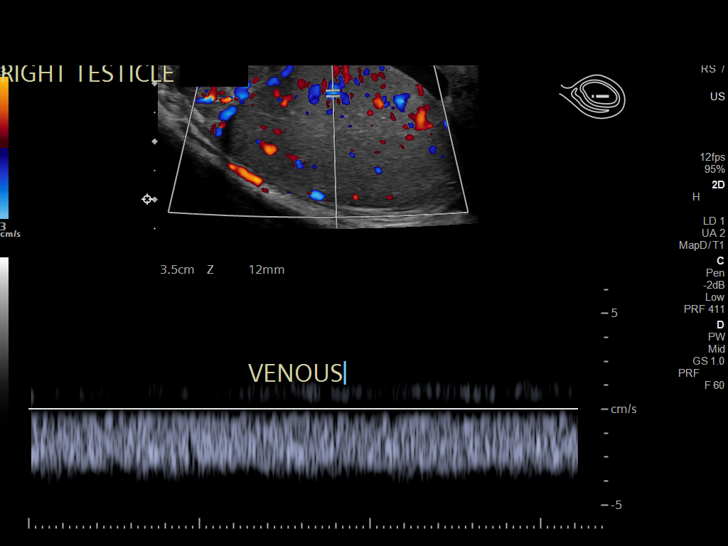
[im 35/83]
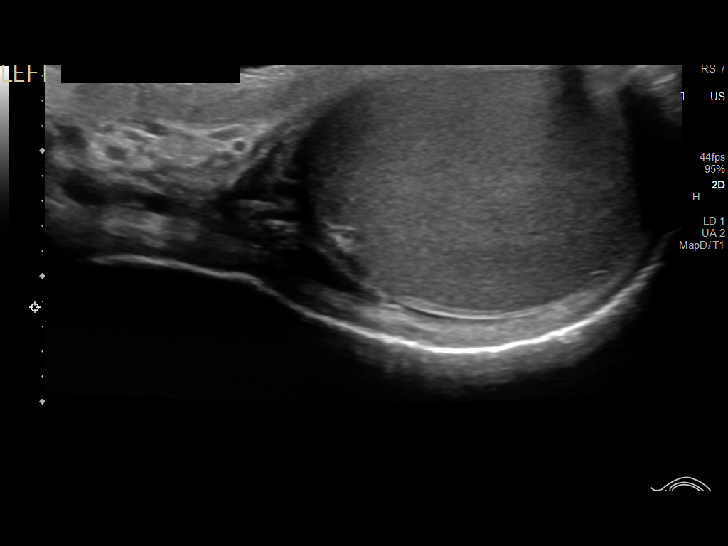
[im 42/83]
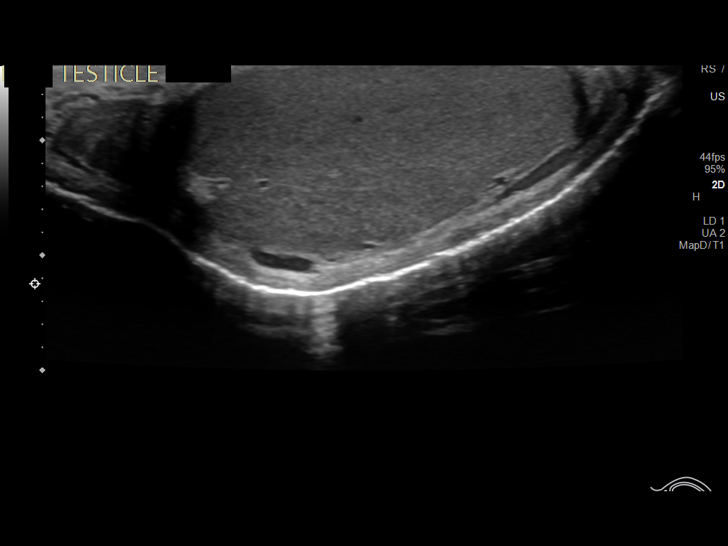
[im 48/83]
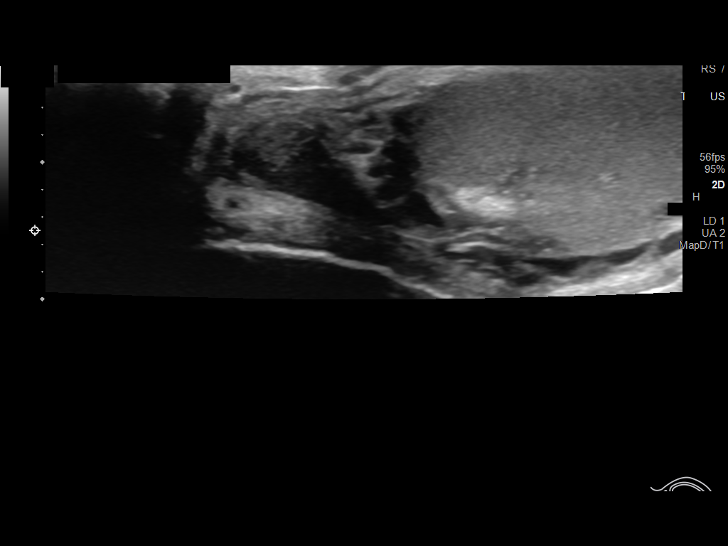
[im 55/83]
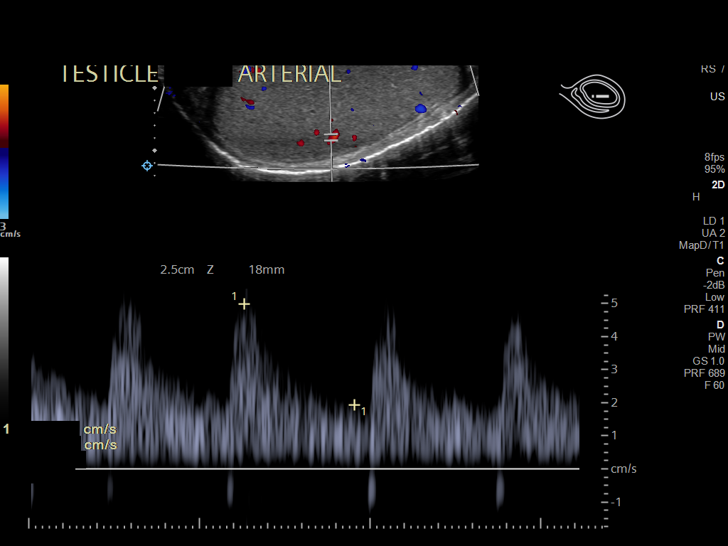
[im 62/83]
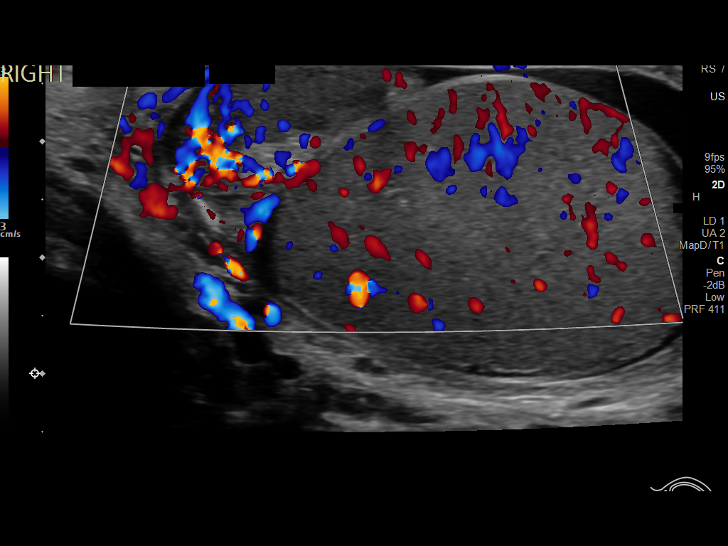
[im 69/83]
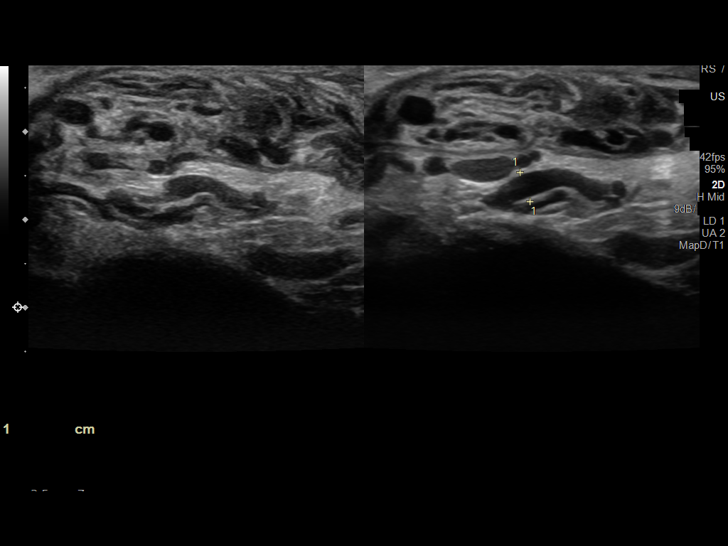
[im 76/83]
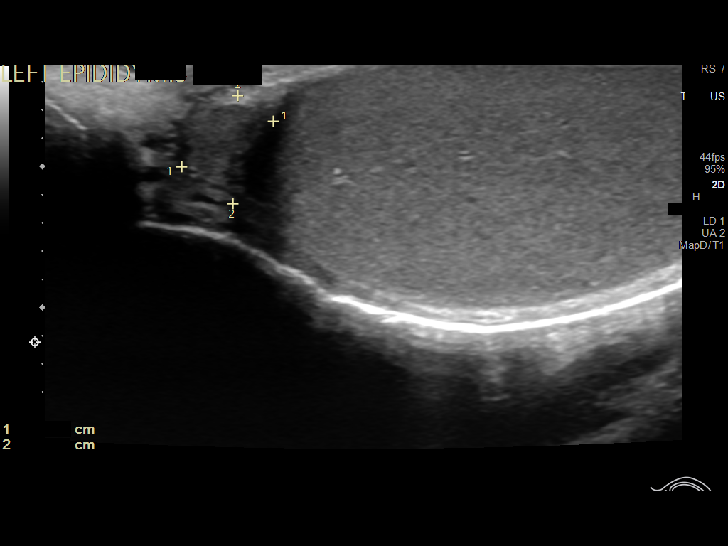
[im 83/83]
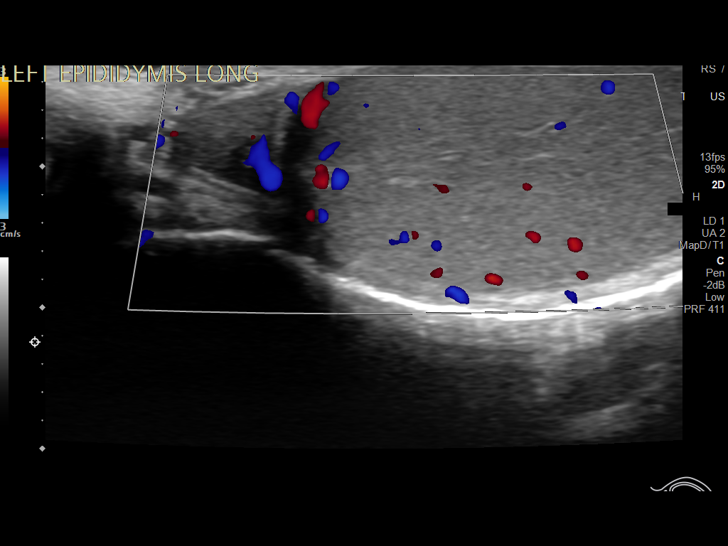

[13 of 25 positions shown; findings below may reference images not displayed]

FINDINGS: Right testicle

Measurements: 4.2 x 2.6 x 3.2 cm, slightly enlarged. Mildly
heterogeneous nature with increased vascularity, consistent with
epididymo-orchitis. No evidence of a focal mass lesion or abscess.

Left testicle

Measurements: 4.1 x 1.7 x 2.8 cm. No focal lesion. Normal color
flow and Doppler evaluation.

Right epididymis: Enlarged and hypervascular consistent with
epididymo-orchitis.

Left epididymis:  Normal in size and appearance.

Hydrocele:  Positive right hydrocele without complicating feature.

Varicocele:  Bilateral varicoceles.

Pulsed Doppler interrogation of both testes demonstrates hyperemia
on the right. No evidence of torsion.
IMPRESSION: Right epididymo-orchitis with mild reactive uncomplicated hydrocele.
No evidence of abscess, mass or torsion.

Bilateral varicoceles.
